# Patient Record
Sex: Female | Born: 1955 | Race: White | Hispanic: No | Marital: Married | Smoking: Current every day smoker
Health system: Southern US, Community
[De-identification: ages and names within clinical notes are randomized; demographics above are authoritative.]

## PROBLEM LIST (undated history)

## (undated) DIAGNOSIS — R569 Unspecified convulsions: Secondary | ICD-10-CM

## (undated) DIAGNOSIS — R42 Dizziness and giddiness: Secondary | ICD-10-CM

## (undated) DIAGNOSIS — H9192 Unspecified hearing loss, left ear: Secondary | ICD-10-CM

## (undated) DIAGNOSIS — F419 Anxiety disorder, unspecified: Secondary | ICD-10-CM

## (undated) HISTORY — DX: Unspecified convulsions: R56.9

## (undated) HISTORY — DX: Dizziness and giddiness: R42

## (undated) HISTORY — DX: Anxiety disorder, unspecified: F41.9

## (undated) HISTORY — DX: Unspecified hearing loss, left ear: H91.92

---

## 2012-12-08 ENCOUNTER — Emergency Department: Payer: Self-pay | Admitting: Unknown Physician Specialty

## 2012-12-08 LAB — COMPREHENSIVE METABOLIC PANEL
Alkaline Phosphatase: 84 U/L (ref 50–136)
Anion Gap: 5 — ABNORMAL LOW (ref 7–16)
Bilirubin,Total: 0.2 mg/dL (ref 0.2–1.0)
Chloride: 103 mmol/L (ref 98–107)
Creatinine: 0.78 mg/dL (ref 0.60–1.30)
EGFR (Non-African Amer.): >60
Glucose: 93 mg/dL (ref 65–99)
Potassium: 4 mmol/L (ref 3.5–5.1)
SGOT(AST): 54 U/L — ABNORMAL HIGH (ref 15–37)
Total Protein: 7.4 g/dL (ref 6.4–8.2)

## 2012-12-08 LAB — CBC
HCT: 40.9 % (ref 35.0–47.0)
HGB: 13.7 g/dL (ref 12.0–16.0)
MCH: 31.1 pg (ref 26.0–34.0)
Platelet: 159 10*3/uL (ref 150–440)
WBC: 7.6 10*3/uL (ref 3.6–11.0)

## 2012-12-08 LAB — LIPASE, BLOOD: Lipase: 171 U/L (ref 73–393)

## 2013-02-11 LAB — COMPREHENSIVE METABOLIC PANEL
Albumin: 3.4 g/dL (ref 3.4–5.0)
Alkaline Phosphatase: 101 U/L (ref 50–136)
BUN: 7 mg/dL (ref 7–18)
Creatinine: 0.82 mg/dL (ref 0.60–1.30)
EGFR (African American): >60
EGFR (Non-African Amer.): >60
Glucose: 134 mg/dL — ABNORMAL HIGH (ref 65–99)
Osmolality: 276 (ref 275–301)
Potassium: 3.7 mmol/L (ref 3.5–5.1)
SGOT(AST): 26 U/L (ref 15–37)
Sodium: 138 mmol/L (ref 136–145)

## 2013-02-11 LAB — URINALYSIS, COMPLETE
Bilirubin,UR: NEGATIVE
Blood: NEGATIVE
Glucose,UR: NEGATIVE mg/dL (ref 0–75)
Ph: 6 (ref 4.5–8.0)
WBC UR: 2 /HPF (ref 0–5)

## 2013-02-11 LAB — CBC WITH DIFFERENTIAL/PLATELET
Basophil %: 0.6 %
Eosinophil #: 0.4 10*3/uL (ref 0.0–0.7)
Lymphocyte %: 22.1 %
MCH: 30.3 pg (ref 26.0–34.0)
MCV: 90 fL (ref 80–100)
Neutrophil #: 5.2 10*3/uL (ref 1.4–6.5)
Neutrophil %: 64.2 %
RBC: 4.28 10*6/uL (ref 3.80–5.20)
WBC: 8.1 10*3/uL (ref 3.6–11.0)

## 2013-02-12 ENCOUNTER — Inpatient Hospital Stay: Payer: Self-pay | Admitting: Internal Medicine

## 2013-02-13 LAB — CBC WITH DIFFERENTIAL/PLATELET
Basophil #: 0 10*3/uL (ref 0.0–0.1)
Basophil %: 0.1 %
Eosinophil %: 0 %
HCT: 33.3 % — ABNORMAL LOW (ref 35.0–47.0)
HGB: 11.1 g/dL — ABNORMAL LOW (ref 12.0–16.0)
Lymphocyte #: 1.2 10*3/uL (ref 1.0–3.6)
Lymphocyte %: 7.4 %
MCH: 30.1 pg (ref 26.0–34.0)
MCHC: 33.4 g/dL (ref 32.0–36.0)
MCV: 90 fL (ref 80–100)
Monocyte #: 0.4 x10 3/mm (ref 0.2–0.9)
Monocyte %: 2.7 %
Neutrophil %: 89.8 %
RBC: 3.7 10*6/uL — ABNORMAL LOW (ref 3.80–5.20)
WBC: 16.5 10*3/uL — ABNORMAL HIGH (ref 3.6–11.0)

## 2013-02-13 LAB — BASIC METABOLIC PANEL
Calcium, Total: 8.8 mg/dL (ref 8.5–10.1)
Chloride: 110 mmol/L — ABNORMAL HIGH (ref 98–107)
Co2: 30 mmol/L (ref 21–32)
Creatinine: 0.81 mg/dL (ref 0.60–1.30)
EGFR (Non-African Amer.): >60
Glucose: 179 mg/dL — ABNORMAL HIGH (ref 65–99)
Osmolality: 287 (ref 275–301)
Potassium: 5.2 mmol/L — ABNORMAL HIGH (ref 3.5–5.1)
Sodium: 142 mmol/L (ref 136–145)

## 2013-02-14 LAB — BASIC METABOLIC PANEL
BUN: 9 mg/dL (ref 7–18)
Creatinine: 0.72 mg/dL (ref 0.60–1.30)
EGFR (African American): >60
Glucose: 163 mg/dL — ABNORMAL HIGH (ref 65–99)
Potassium: 4.3 mmol/L (ref 3.5–5.1)

## 2013-02-17 LAB — CULTURE, BLOOD (SINGLE)

## 2013-06-05 ENCOUNTER — Ambulatory Visit: Payer: Self-pay

## 2013-06-10 ENCOUNTER — Ambulatory Visit: Payer: Self-pay | Admitting: Emergency Medicine

## 2013-07-26 ENCOUNTER — Emergency Department: Payer: Self-pay | Admitting: Emergency Medicine

## 2013-07-26 LAB — CBC
HCT: 39.5 % (ref 35.0–47.0)
HGB: 13.4 g/dL (ref 12.0–16.0)
MCHC: 33.9 g/dL (ref 32.0–36.0)
Platelet: 180 10*3/uL (ref 150–440)
WBC: 8.8 10*3/uL (ref 3.6–11.0)

## 2013-07-26 LAB — URINALYSIS, COMPLETE
Bilirubin,UR: NEGATIVE
Blood: NEGATIVE
Glucose,UR: NEGATIVE mg/dL (ref 0–75)
Ketone: NEGATIVE
Nitrite: NEGATIVE
Ph: 6 (ref 4.5–8.0)
Squamous Epithelial: 1
WBC UR: 3 /HPF (ref 0–5)

## 2013-07-26 LAB — COMPREHENSIVE METABOLIC PANEL
Anion Gap: 8 (ref 7–16)
BUN: 14 mg/dL (ref 7–18)
Calcium, Total: 8.8 mg/dL (ref 8.5–10.1)
Chloride: 104 mmol/L (ref 98–107)
Co2: 26 mmol/L (ref 21–32)
Creatinine: 0.86 mg/dL (ref 0.60–1.30)
EGFR (African American): >60
EGFR (Non-African Amer.): >60
Glucose: 82 mg/dL (ref 65–99)
Potassium: 3.5 mmol/L (ref 3.5–5.1)
SGOT(AST): 20 U/L (ref 15–37)
SGPT (ALT): 22 U/L (ref 12–78)
Total Protein: 7.1 g/dL (ref 6.4–8.2)

## 2013-07-26 LAB — DRUG SCREEN, URINE
Amphetamines, Ur Screen: NEGATIVE (ref ?–1000)
Barbiturates, Ur Screen: NEGATIVE (ref ?–200)
Cannabinoid 50 Ng, Ur ~~LOC~~: NEGATIVE (ref ?–50)
Cocaine Metabolite,Ur ~~LOC~~: NEGATIVE (ref ?–300)
MDMA (Ecstasy)Ur Screen: NEGATIVE (ref ?–500)
Methadone, Ur Screen: NEGATIVE (ref ?–300)
Opiate, Ur Screen: NEGATIVE (ref ?–300)
Phencyclidine (PCP) Ur S: NEGATIVE (ref ?–25)
Tricyclic, Ur Screen: NEGATIVE (ref ?–1000)

## 2013-07-26 LAB — ETHANOL
Ethanol %: 0.254 % — ABNORMAL HIGH (ref 0.000–0.080)
Ethanol: 254 mg/dL

## 2013-09-04 ENCOUNTER — Emergency Department: Payer: Self-pay | Admitting: Emergency Medicine

## 2013-09-04 LAB — COMPREHENSIVE METABOLIC PANEL
ALBUMIN: 4 g/dL (ref 3.4–5.0)
ALK PHOS: 78 U/L
Anion Gap: 9 (ref 7–16)
BUN: 9 mg/dL (ref 7–18)
Bilirubin,Total: 0.4 mg/dL (ref 0.2–1.0)
CO2: 23 mmol/L (ref 21–32)
Calcium, Total: 9.9 mg/dL (ref 8.5–10.1)
Chloride: 100 mmol/L (ref 98–107)
Creatinine: 1.15 mg/dL (ref 0.60–1.30)
EGFR (African American): >60
EGFR (Non-African Amer.): 53 — ABNORMAL LOW
Glucose: 125 mg/dL — ABNORMAL HIGH (ref 65–99)
Osmolality: 265 (ref 275–301)
Potassium: 3.8 mmol/L (ref 3.5–5.1)
SGOT(AST): 16 U/L (ref 15–37)
SGPT (ALT): 18 U/L (ref 12–78)
SODIUM: 132 mmol/L — AB (ref 136–145)
Total Protein: 7.8 g/dL (ref 6.4–8.2)

## 2013-09-04 LAB — URINALYSIS, COMPLETE
BILIRUBIN, UR: NEGATIVE
Bacteria: NONE SEEN
Blood: NEGATIVE
Glucose,UR: NEGATIVE mg/dL (ref 0–75)
KETONE: NEGATIVE
Nitrite: NEGATIVE
Ph: 7 (ref 4.5–8.0)
Protein: NEGATIVE
RBC,UR: NONE SEEN /HPF (ref 0–5)
SPECIFIC GRAVITY: 1.012 (ref 1.003–1.030)
Squamous Epithelial: <1

## 2013-09-04 LAB — CBC WITH DIFFERENTIAL/PLATELET
Basophil #: 0.1 10*3/uL (ref 0.0–0.1)
Basophil %: 0.6 %
EOS PCT: 0.5 %
Eosinophil #: 0.1 10*3/uL (ref 0.0–0.7)
HCT: 41.9 % (ref 35.0–47.0)
HGB: 14.6 g/dL (ref 12.0–16.0)
LYMPHS ABS: 3 10*3/uL (ref 1.0–3.6)
Lymphocyte %: 27.5 %
MCH: 31.3 pg (ref 26.0–34.0)
MCHC: 34.8 g/dL (ref 32.0–36.0)
MCV: 90 fL (ref 80–100)
Monocyte #: 0.5 x10 3/mm (ref 0.2–0.9)
Monocyte %: 4.5 %
NEUTROS PCT: 66.9 %
Neutrophil #: 7.3 10*3/uL — ABNORMAL HIGH (ref 1.4–6.5)
Platelet: 227 10*3/uL (ref 150–440)
RBC: 4.66 10*6/uL (ref 3.80–5.20)
RDW: 12.9 % (ref 11.5–14.5)
WBC: 11 10*3/uL (ref 3.6–11.0)

## 2013-09-04 LAB — HEMOGLOBIN A1C: Hemoglobin A1C: 5.4 % (ref 4.2–6.3)

## 2013-09-04 LAB — TROPONIN I: Troponin-I: <0.02 ng/mL

## 2013-09-06 ENCOUNTER — Inpatient Hospital Stay: Payer: Self-pay | Admitting: Internal Medicine

## 2013-09-06 LAB — CBC
HCT: 41 % (ref 35.0–47.0)
HGB: 14.1 g/dL (ref 12.0–16.0)
MCH: 30.6 pg (ref 26.0–34.0)
MCHC: 34.4 g/dL (ref 32.0–36.0)
MCV: 89 fL (ref 80–100)
Platelet: 217 10*3/uL (ref 150–440)
RBC: 4.61 10*6/uL (ref 3.80–5.20)
RDW: 12.7 % (ref 11.5–14.5)
WBC: 10.4 10*3/uL (ref 3.6–11.0)

## 2013-09-06 LAB — COMPREHENSIVE METABOLIC PANEL
ALBUMIN: 3.8 g/dL (ref 3.4–5.0)
AST: 16 U/L (ref 15–37)
Alkaline Phosphatase: 69 U/L
Anion Gap: 8 (ref 7–16)
BILIRUBIN TOTAL: 0.4 mg/dL (ref 0.2–1.0)
BUN: 21 mg/dL — AB (ref 7–18)
CREATININE: 1.74 mg/dL — AB (ref 0.60–1.30)
Calcium, Total: 9.4 mg/dL (ref 8.5–10.1)
Chloride: 102 mmol/L (ref 98–107)
Co2: 24 mmol/L (ref 21–32)
EGFR (Non-African Amer.): 32 — ABNORMAL LOW
GFR CALC AF AMER: 37 — AB
GLUCOSE: 114 mg/dL — AB (ref 65–99)
OSMOLALITY: 272 (ref 275–301)
Potassium: 3.5 mmol/L (ref 3.5–5.1)
SGPT (ALT): 15 U/L (ref 12–78)
Sodium: 134 mmol/L — ABNORMAL LOW (ref 136–145)
Total Protein: 7 g/dL (ref 6.4–8.2)

## 2013-09-06 LAB — TROPONIN I
Troponin-I: <0.02 ng/mL
Troponin-I: <0.02 ng/mL
Troponin-I: <0.02 ng/mL

## 2013-09-06 LAB — TSH: Thyroid Stimulating Horm: 3.11 u[IU]/mL

## 2013-09-06 LAB — CK TOTAL AND CKMB (NOT AT ARMC)
CK, Total: 92 U/L (ref 21–215)
CK-MB: 0.6 ng/mL (ref 0.5–3.6)

## 2013-09-06 LAB — CK-MB: CK-MB: 0.9 ng/mL (ref 0.5–3.6)

## 2013-09-06 LAB — PROTIME-INR
INR: 1
Prothrombin Time: 12.9 secs (ref 11.5–14.7)

## 2013-09-06 LAB — APTT: Activated PTT: 30.6 secs (ref 23.6–35.9)

## 2013-09-06 LAB — T4, FREE: Free Thyroxine: 1.28 ng/dL (ref 0.76–1.46)

## 2013-09-07 LAB — CBC WITH DIFFERENTIAL/PLATELET
BASOS ABS: 0 10*3/uL (ref 0.0–0.1)
BASOS PCT: 0.6 %
EOS ABS: 0.1 10*3/uL (ref 0.0–0.7)
Eosinophil %: 1 %
HCT: 34 % — ABNORMAL LOW (ref 35.0–47.0)
HGB: 11.4 g/dL — AB (ref 12.0–16.0)
Lymphocyte #: 2.9 10*3/uL (ref 1.0–3.6)
Lymphocyte %: 34.3 %
MCH: 30.3 pg (ref 26.0–34.0)
MCHC: 33.4 g/dL (ref 32.0–36.0)
MCV: 91 fL (ref 80–100)
MONO ABS: 0.4 x10 3/mm (ref 0.2–0.9)
MONOS PCT: 4.3 %
NEUTROS ABS: 5.1 10*3/uL (ref 1.4–6.5)
Neutrophil %: 59.8 %
Platelet: 170 10*3/uL (ref 150–440)
RBC: 3.75 10*6/uL — AB (ref 3.80–5.20)
RDW: 13.1 % (ref 11.5–14.5)
WBC: 8.6 10*3/uL (ref 3.6–11.0)

## 2013-09-07 LAB — LIPID PANEL
Cholesterol: 145 mg/dL (ref 0–200)
HDL Cholesterol: 40 mg/dL (ref 40–60)
Ldl Cholesterol, Calc: 79 mg/dL (ref 0–100)
Triglycerides: 128 mg/dL (ref 0–200)
VLDL CHOLESTEROL, CALC: 26 mg/dL (ref 5–40)

## 2013-09-07 LAB — BASIC METABOLIC PANEL
Anion Gap: 5 — ABNORMAL LOW (ref 7–16)
BUN: 18 mg/dL (ref 7–18)
CALCIUM: 8.1 mg/dL — AB (ref 8.5–10.1)
CHLORIDE: 106 mmol/L (ref 98–107)
CREATININE: 1.25 mg/dL (ref 0.60–1.30)
Co2: 27 mmol/L (ref 21–32)
EGFR (African American): 56 — ABNORMAL LOW
EGFR (Non-African Amer.): 48 — ABNORMAL LOW
GLUCOSE: 124 mg/dL — AB (ref 65–99)
Osmolality: 279 (ref 275–301)
Potassium: 3.4 mmol/L — ABNORMAL LOW (ref 3.5–5.1)
SODIUM: 138 mmol/L (ref 136–145)

## 2013-09-07 LAB — CK-MB: CK-MB: 0.9 ng/mL (ref 0.5–3.6)

## 2013-09-07 LAB — TROPONIN I: Troponin-I: <0.02 ng/mL

## 2013-09-18 DIAGNOSIS — R42 Dizziness and giddiness: Secondary | ICD-10-CM | POA: Insufficient documentation

## 2013-09-18 LAB — BASIC METABOLIC PANEL
BUN: 11 mg/dL (ref 4–21)
CREATININE: 0.9 mg/dL (ref 0.5–1.1)
Glucose: 94 mg/dL
Potassium: 4.3 mmol/L (ref 3.4–5.3)
Sodium: 142 mmol/L (ref 137–147)

## 2013-09-21 ENCOUNTER — Emergency Department: Payer: Self-pay | Admitting: Emergency Medicine

## 2013-09-21 LAB — URINALYSIS, COMPLETE
Bacteria: NONE SEEN
Bilirubin,UR: NEGATIVE
Blood: NEGATIVE
Glucose,UR: NEGATIVE mg/dL (ref 0–75)
Nitrite: NEGATIVE
Ph: 8 (ref 4.5–8.0)
Protein: 30
RBC,UR: 1 /HPF (ref 0–5)
Specific Gravity: 1.02 (ref 1.003–1.030)
WBC UR: 1 /HPF (ref 0–5)

## 2013-09-21 LAB — CBC WITH DIFFERENTIAL/PLATELET
BASOS ABS: 0 10*3/uL (ref 0.0–0.1)
Basophil %: 0.5 %
Eosinophil #: 0 10*3/uL (ref 0.0–0.7)
Eosinophil %: 0.3 %
HCT: 41.4 % (ref 35.0–47.0)
HGB: 14 g/dL (ref 12.0–16.0)
LYMPHS ABS: 1.6 10*3/uL (ref 1.0–3.6)
LYMPHS PCT: 17.4 %
MCH: 31.1 pg (ref 26.0–34.0)
MCHC: 33.9 g/dL (ref 32.0–36.0)
MCV: 92 fL (ref 80–100)
MONOS PCT: 3.5 %
Monocyte #: 0.3 x10 3/mm (ref 0.2–0.9)
Neutrophil #: 7.1 10*3/uL — ABNORMAL HIGH (ref 1.4–6.5)
Neutrophil %: 78.3 %
Platelet: 194 10*3/uL (ref 150–440)
RBC: 4.51 10*6/uL (ref 3.80–5.20)
RDW: 13 % (ref 11.5–14.5)
WBC: 9 10*3/uL (ref 3.6–11.0)

## 2013-09-21 LAB — TROPONIN I

## 2013-09-21 LAB — BASIC METABOLIC PANEL
Anion Gap: 7 (ref 7–16)
BUN: 13 mg/dL (ref 7–18)
CHLORIDE: 104 mmol/L (ref 98–107)
Calcium, Total: 9.5 mg/dL (ref 8.5–10.1)
Co2: 25 mmol/L (ref 21–32)
Creatinine: 1.14 mg/dL (ref 0.60–1.30)
EGFR (African American): >60
GFR CALC NON AF AMER: 54 — AB
GLUCOSE: 132 mg/dL — AB (ref 65–99)
Osmolality: 274 (ref 275–301)
Potassium: 4.2 mmol/L (ref 3.5–5.1)
SODIUM: 136 mmol/L (ref 136–145)

## 2013-09-29 ENCOUNTER — Emergency Department: Payer: Self-pay | Admitting: Emergency Medicine

## 2013-10-10 ENCOUNTER — Emergency Department: Payer: Self-pay | Admitting: Emergency Medicine

## 2013-10-10 LAB — COMPREHENSIVE METABOLIC PANEL
ALK PHOS: 76 U/L
Albumin: 4 g/dL (ref 3.4–5.0)
Anion Gap: 8 (ref 7–16)
BUN: 14 mg/dL (ref 7–18)
Bilirubin,Total: 0.5 mg/dL (ref 0.2–1.0)
CO2: 27 mmol/L (ref 21–32)
Calcium, Total: 9.4 mg/dL (ref 8.5–10.1)
Chloride: 102 mmol/L (ref 98–107)
Creatinine: 1.08 mg/dL (ref 0.60–1.30)
EGFR (African American): >60
GFR CALC NON AF AMER: 57 — AB
GLUCOSE: 138 mg/dL — AB (ref 65–99)
Osmolality: 276 (ref 275–301)
POTASSIUM: 3.9 mmol/L (ref 3.5–5.1)
SGOT(AST): 20 U/L (ref 15–37)
SGPT (ALT): 28 U/L (ref 12–78)
SODIUM: 137 mmol/L (ref 136–145)
Total Protein: 7.6 g/dL (ref 6.4–8.2)

## 2013-10-10 LAB — DRUG SCREEN, URINE
Amphetamines, Ur Screen: NEGATIVE (ref ?–1000)
BARBITURATES, UR SCREEN: NEGATIVE (ref ?–200)
Benzodiazepine, Ur Scrn: POSITIVE (ref ?–200)
COCAINE METABOLITE, UR ~~LOC~~: NEGATIVE (ref ?–300)
Cannabinoid 50 Ng, Ur ~~LOC~~: NEGATIVE (ref ?–50)
MDMA (Ecstasy)Ur Screen: NEGATIVE (ref ?–500)
METHADONE, UR SCREEN: NEGATIVE (ref ?–300)
Opiate, Ur Screen: NEGATIVE (ref ?–300)
Phencyclidine (PCP) Ur S: NEGATIVE (ref ?–25)
Tricyclic, Ur Screen: NEGATIVE (ref ?–1000)

## 2013-10-10 LAB — URINALYSIS, COMPLETE
BLOOD: NEGATIVE
Bilirubin,UR: NEGATIVE
GLUCOSE, UR: NEGATIVE mg/dL (ref 0–75)
Hyaline Cast: 1
NITRITE: NEGATIVE
PH: 6 (ref 4.5–8.0)
Protein: NEGATIVE
RBC,UR: 6 /HPF (ref 0–5)
Specific Gravity: 1.018 (ref 1.003–1.030)
Squamous Epithelial: 5

## 2013-10-10 LAB — ETHANOL

## 2013-10-10 LAB — CBC
HCT: 43.4 % (ref 35.0–47.0)
HGB: 14.5 g/dL (ref 12.0–16.0)
MCH: 30.3 pg (ref 26.0–34.0)
MCHC: 33.3 g/dL (ref 32.0–36.0)
MCV: 91 fL (ref 80–100)
Platelet: 256 10*3/uL (ref 150–440)
RBC: 4.76 10*6/uL (ref 3.80–5.20)
RDW: 13 % (ref 11.5–14.5)
WBC: 12.4 10*3/uL — ABNORMAL HIGH (ref 3.6–11.0)

## 2013-11-06 ENCOUNTER — Inpatient Hospital Stay: Payer: Self-pay | Admitting: Internal Medicine

## 2013-11-06 LAB — TROPONIN I

## 2013-11-06 LAB — PRO B NATRIURETIC PEPTIDE: B-TYPE NATIURETIC PEPTID: 70 pg/mL (ref 0–125)

## 2013-11-06 LAB — COMPREHENSIVE METABOLIC PANEL
ALBUMIN: 4.2 g/dL (ref 3.4–5.0)
ALK PHOS: 69 U/L
AST: 14 U/L — AB (ref 15–37)
Anion Gap: 6 — ABNORMAL LOW (ref 7–16)
BUN: 16 mg/dL (ref 7–18)
Bilirubin,Total: 0.5 mg/dL (ref 0.2–1.0)
CALCIUM: 9.6 mg/dL (ref 8.5–10.1)
CHLORIDE: 102 mmol/L (ref 98–107)
Co2: 27 mmol/L (ref 21–32)
Creatinine: 1.67 mg/dL — ABNORMAL HIGH (ref 0.60–1.30)
EGFR (African American): 39 — ABNORMAL LOW
EGFR (Non-African Amer.): 34 — ABNORMAL LOW
Glucose: 104 mg/dL — ABNORMAL HIGH (ref 65–99)
Osmolality: 272 (ref 275–301)
POTASSIUM: 3.4 mmol/L — AB (ref 3.5–5.1)
SGPT (ALT): 15 U/L (ref 12–78)
Sodium: 135 mmol/L — ABNORMAL LOW (ref 136–145)
Total Protein: 7.3 g/dL (ref 6.4–8.2)

## 2013-11-06 LAB — URINALYSIS, COMPLETE
BILIRUBIN, UR: NEGATIVE
Bacteria: NONE SEEN
Blood: NEGATIVE
GLUCOSE, UR: NEGATIVE mg/dL (ref 0–75)
Hyaline Cast: 18
LEUKOCYTE ESTERASE: NEGATIVE
NITRITE: NEGATIVE
Ph: 5 (ref 4.5–8.0)
Protein: NEGATIVE
SPECIFIC GRAVITY: 1.006 (ref 1.003–1.030)

## 2013-11-06 LAB — CBC
HCT: 38.6 % (ref 35.0–47.0)
HGB: 13 g/dL (ref 12.0–16.0)
MCH: 30.6 pg (ref 26.0–34.0)
MCHC: 33.7 g/dL (ref 32.0–36.0)
MCV: 91 fL (ref 80–100)
Platelet: 193 10*3/uL (ref 150–440)
RBC: 4.25 10*6/uL (ref 3.80–5.20)
RDW: 14 % (ref 11.5–14.5)
WBC: 9.3 10*3/uL (ref 3.6–11.0)

## 2013-11-07 LAB — CBC WITH DIFFERENTIAL/PLATELET
Basophil #: 0 10*3/uL (ref 0.0–0.1)
Basophil %: 0.6 %
EOS ABS: 0.2 10*3/uL (ref 0.0–0.7)
EOS PCT: 3 %
HCT: 32.3 % — ABNORMAL LOW (ref 35.0–47.0)
HGB: 10.8 g/dL — AB (ref 12.0–16.0)
LYMPHS ABS: 1.8 10*3/uL (ref 1.0–3.6)
Lymphocyte %: 33.6 %
MCH: 30.9 pg (ref 26.0–34.0)
MCHC: 33.4 g/dL (ref 32.0–36.0)
MCV: 93 fL (ref 80–100)
Monocyte #: 0.4 x10 3/mm (ref 0.2–0.9)
Monocyte %: 7.5 %
NEUTROS ABS: 3 10*3/uL (ref 1.4–6.5)
NEUTROS PCT: 55.3 %
Platelet: 132 10*3/uL — ABNORMAL LOW (ref 150–440)
RBC: 3.5 10*6/uL — ABNORMAL LOW (ref 3.80–5.20)
RDW: 13.9 % (ref 11.5–14.5)
WBC: 5.4 10*3/uL (ref 3.6–11.0)

## 2013-11-07 LAB — BASIC METABOLIC PANEL
ANION GAP: 3 — AB (ref 7–16)
BUN: 17 mg/dL (ref 7–18)
CALCIUM: 7.8 mg/dL — AB (ref 8.5–10.1)
CHLORIDE: 112 mmol/L — AB (ref 98–107)
CO2: 27 mmol/L (ref 21–32)
Creatinine: 1.05 mg/dL (ref 0.60–1.30)
EGFR (African American): >60
GFR CALC NON AF AMER: 59 — AB
Glucose: 89 mg/dL (ref 65–99)
OSMOLALITY: 284 (ref 275–301)
Potassium: 4.3 mmol/L (ref 3.5–5.1)
Sodium: 142 mmol/L (ref 136–145)

## 2013-11-07 LAB — LIPID PANEL
CHOLESTEROL: 167 mg/dL (ref 0–200)
HDL Cholesterol: 36 mg/dL — ABNORMAL LOW (ref 40–60)
LDL CHOLESTEROL, CALC: 111 mg/dL — AB (ref 0–100)
Triglycerides: 102 mg/dL (ref 0–200)
VLDL CHOLESTEROL, CALC: 20 mg/dL (ref 5–40)

## 2013-11-25 DIAGNOSIS — G40909 Epilepsy, unspecified, not intractable, without status epilepticus: Secondary | ICD-10-CM | POA: Insufficient documentation

## 2014-08-07 IMAGING — CT CT HEAD WITHOUT CONTRAST
3 of 6 series · 14 of 47 positions shown, 16 images · non-contrast
Comparison: 09/21/2013

CLINICAL DATA: Patient fell. History of vertigo and anxiety. Head
injury.

EXAM:
CT HEAD WITHOUT CONTRAST
CT CERVICAL SPINE WITHOUT CONTRAST
TECHNIQUE: Multidetector CT imaging of the head and cervical spine was
performed following the standard protocol without intravenous
contrast. Multiplanar CT image reconstructions of the cervical spine
were also generated.

[Series 5: soft tissue · axial · 0.35mm/px · z∈[-186,-36]mm · 8 of 97 slices shown, 10 images]
[im 11/97  brain]
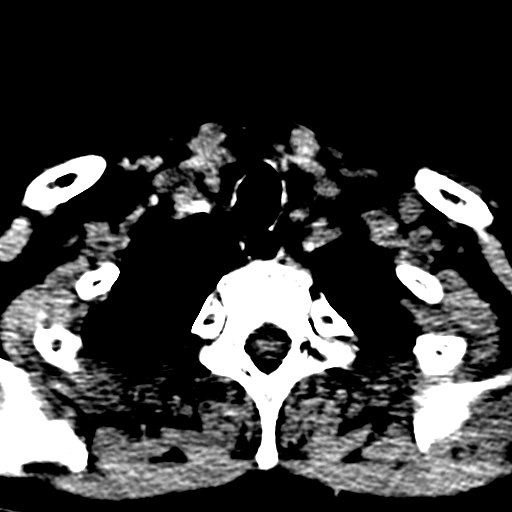
[im 11/97  bone]
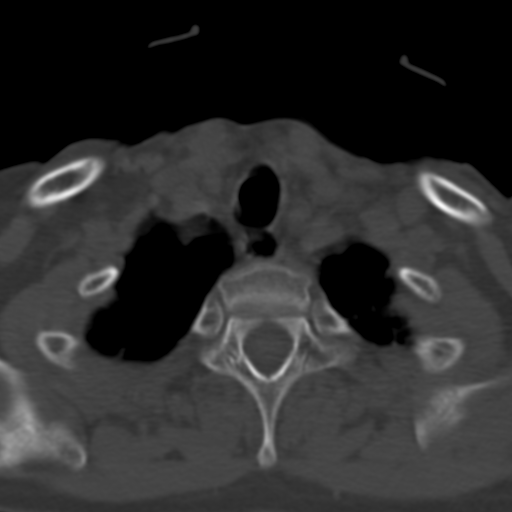
[im 22/97  brain]
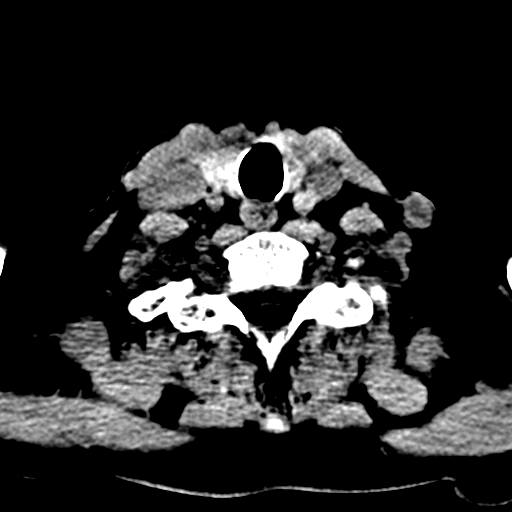
[im 33/97  brain]
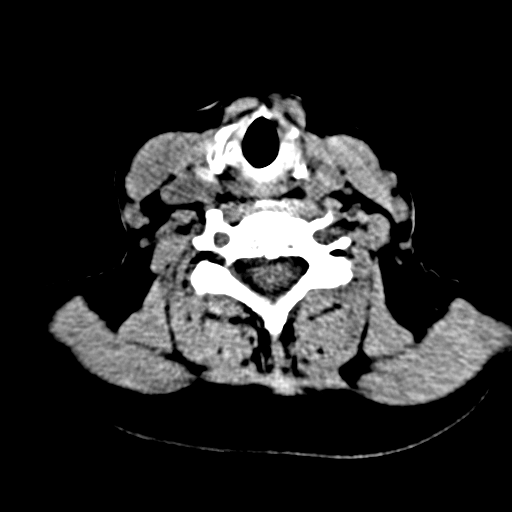
[im 43/97  brain]
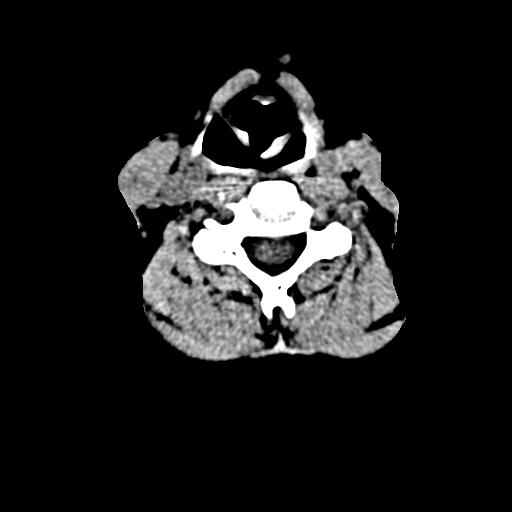
[im 54/97  brain]
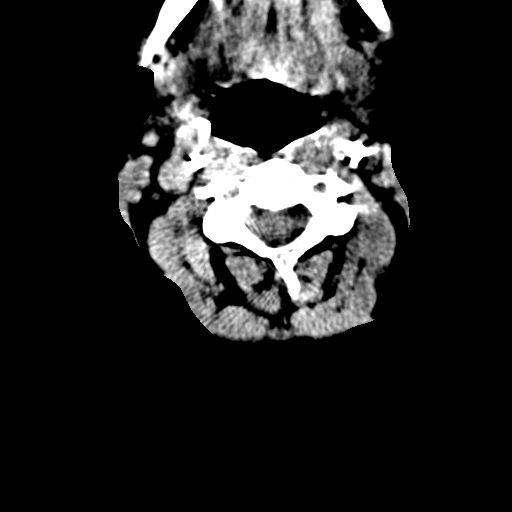
[im 54/97  bone]
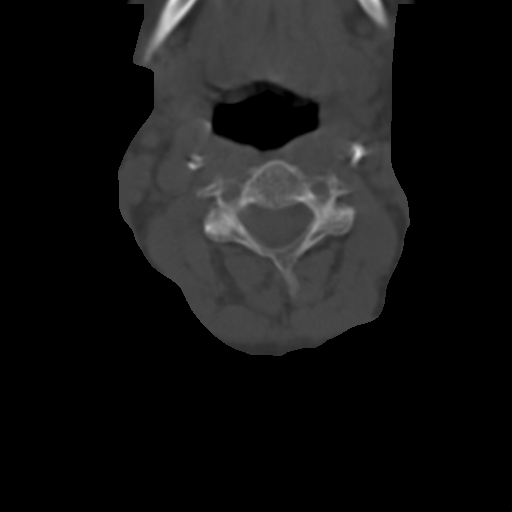
[im 65/97  brain]
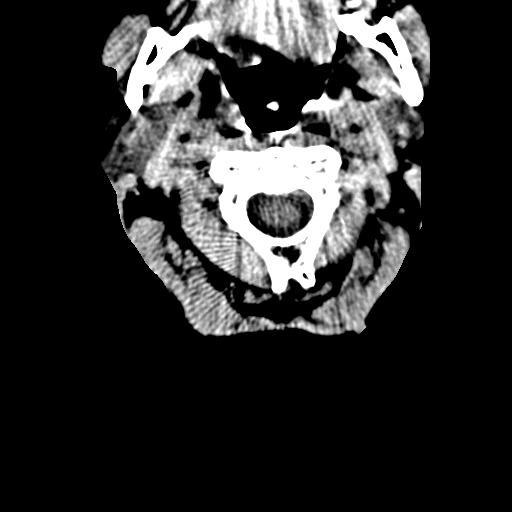
[im 75/97  brain]
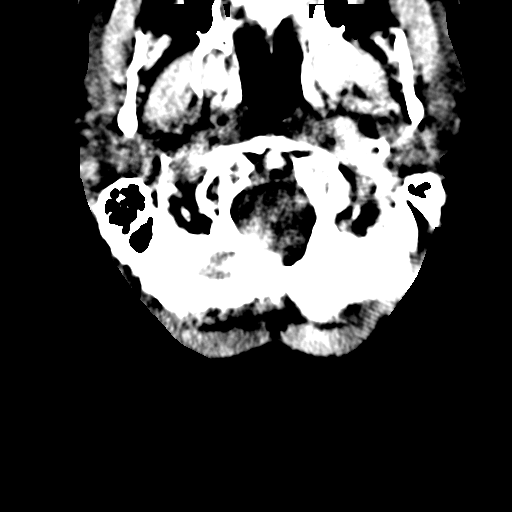
[im 86/97  brain]
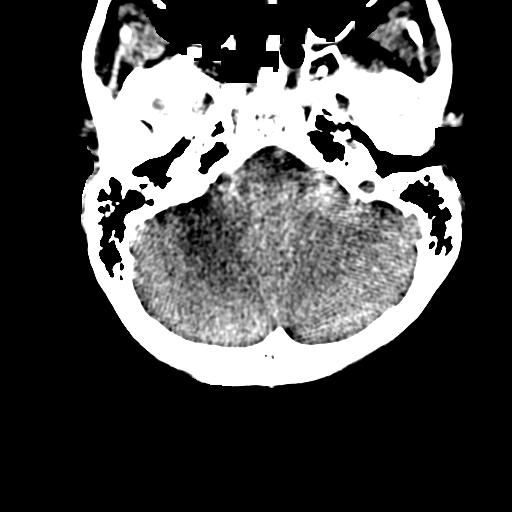

[Series 6: sagittal bone · sagittal · 0.21mm/px · 3 of 57 slices shown]
[im 19/57  brain]
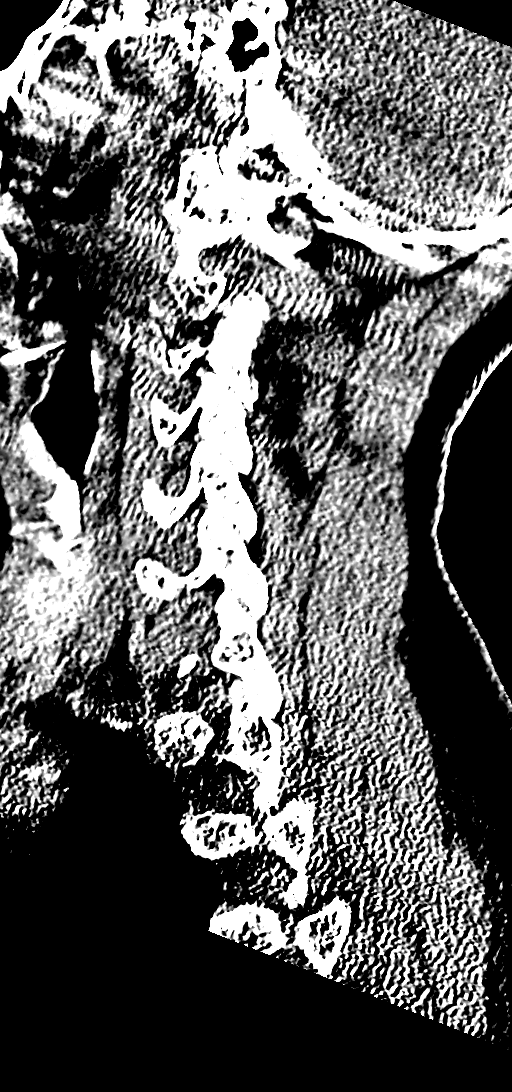
[im 29/57  brain]
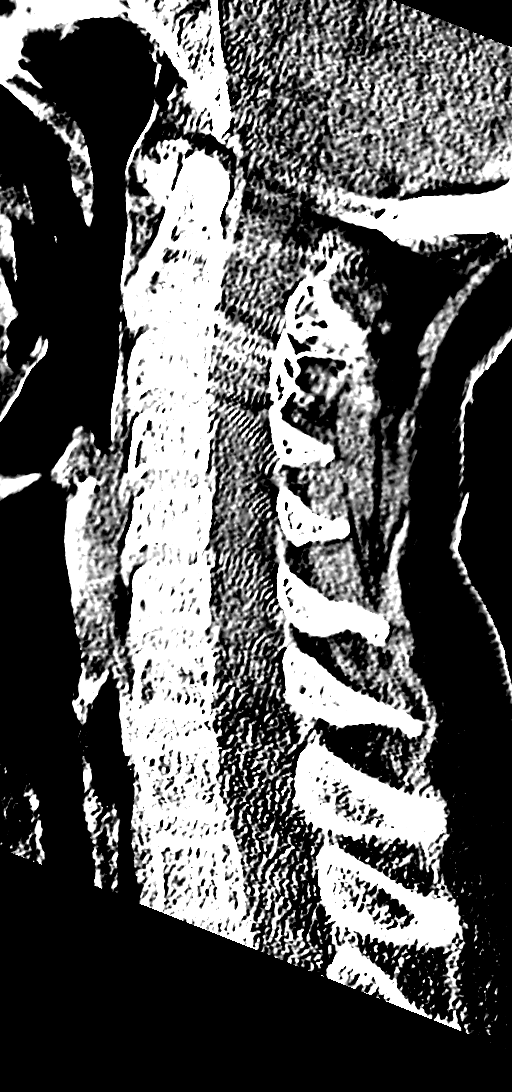
[im 38/57  brain]
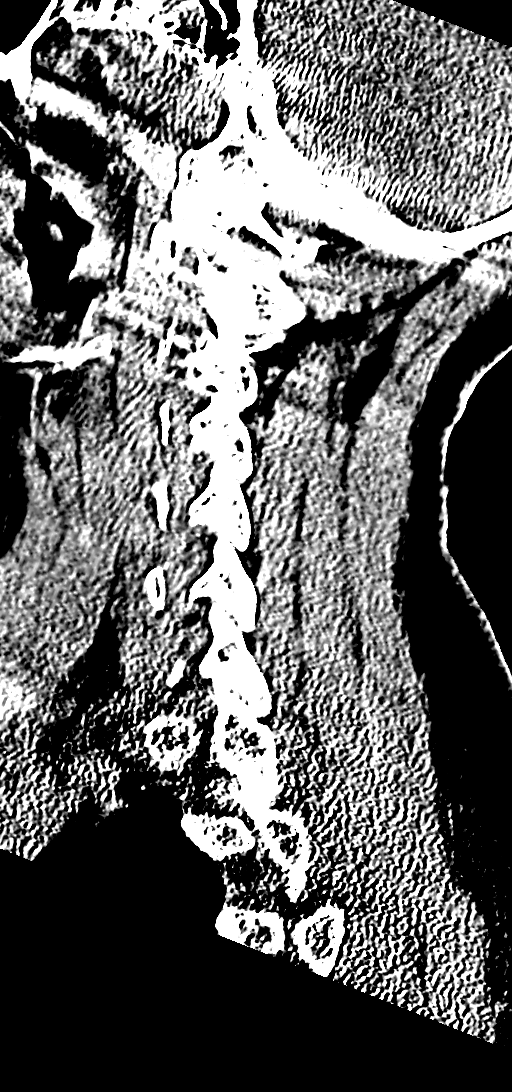

[Series 7: coronal bone · coronal · 0.38mm/px · 3 of 42 slices shown]
[im 14/42  brain]
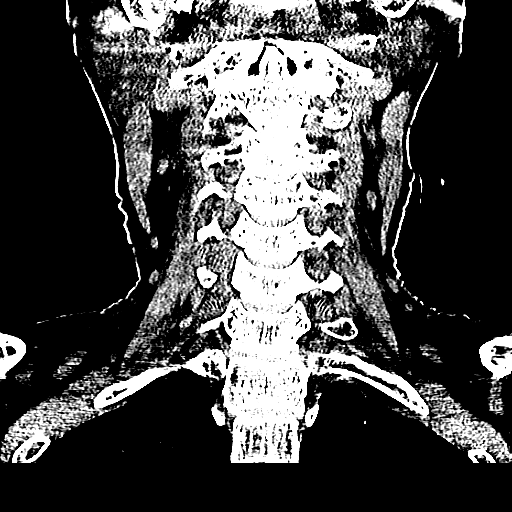
[im 19/42  brain]
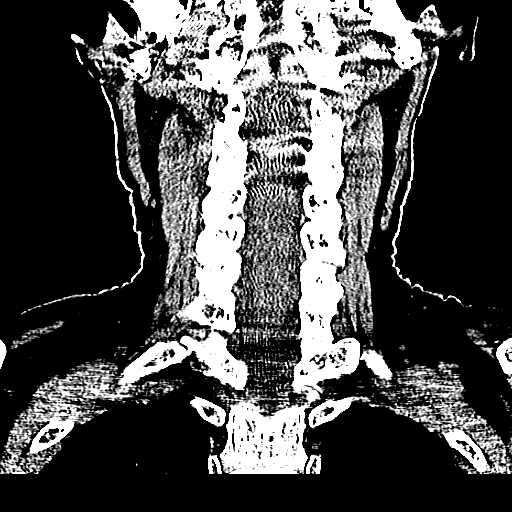
[im 23/42  brain]
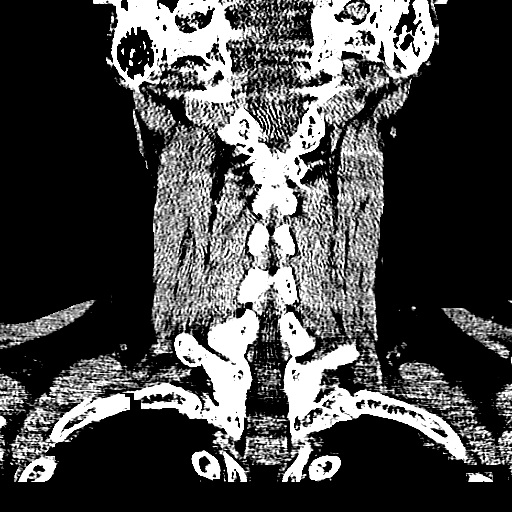

[14 of 47 positions shown; findings below may reference images not displayed]

FINDINGS: CT HEAD FINDINGS

There is no intra or extra-axial fluid collection or mass lesion.
The basilar cisterns and ventricles have a normal appearance. There
is no CT evidence for acute infarction or hemorrhage. Bone windows
are unremarkable.

CT CERVICAL SPINE FINDINGS

There is patient motion artifact. There is normal alignment of the
cervical spine. There is no evidence for acute fracture or
dislocation. Prevertebral soft tissues have a normal appearance.
Lung apices have a normal appearance.
IMPRESSION: 1.  No evidence for acute intracranial abnormality.
2. No evidence for acute abnormality of the cervical spine.
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
## 2014-12-11 NOTE — Discharge Summary (Signed)
PATIENT NAME:  [name] Hood, [name] MR#:  [id number] DATE OF BIRTH:  08-Oct-1955  DATE OF ADMISSION:  02/12/2013 DATE OF DISCHARGE:  02/14/2013  CHIEF COMPLAINT:  Shortness of breath, cough.    PRIMARY CARE PHYSICIAN:  The patient has no PCP here.  She moved from [place] recently.   DISCHARGE DIAGNOSES: 1.  Shortness of breath, likely secondary to underlying chronic obstructive pulmonary disease which is not diagnosed officially and acute bronchitis.  2.  Hyperkalemia.  3.  Nausea and vomiting, likely secondary to gagging, resolved.  4.  Leukocytosis, likely from steroids.  5.  Right ear chronic deafness and dizziness.   DISCHARGE MEDICATIONS:  1.  Vantin 200 mg every 12 hours for four days.  2.  Prednisone taper started 50 mg and taper by 10 mg daily until done in 5 days.  3.  Albuterol CFC free 2 puffs 4 times a day as needed for shortness of breath.   DIET:  Low sodium.   ACTIVITY:  As tolerated.   FOLLOW-UP:  Please follow with PCP after [facility] her Medicaid [facility] and obtaining one here within 1 to 2 weeks.   DISPOSITION:  Home.   HISTORY OF PRESENT ILLNESS AND HOSPITAL COURSE:  For full details of H and P, please see the dictation by Dr. Rudene Rearwish on June 25, but briefly this is a 59 year old female with a long time smoker, quit 1-1/2 years ago who came in for a productive cough, nausea, vomiting secondary to a cough reflex and right-sided chest pain with breathing.  She was given nebs and IV antibiotics, underwent an x-ray of the chest and a CT scan.  The CAT scan did not pick up any evidence for PE, but did show some borderline to mildly enlarged mediastinal lymph nodes which were nonspecific.  There was no CHF, nor pneumonia.  There was no pericardial effusion or pneumothorax.  The patient was admitted to the hospitalist service, antibiotics were changed to ceftriaxone as the patient might have had ALLERGY TO LEVAQUIN.  "Allergy was described as right-sided chest pain, which was  exacerbated."  The patient does have right-sided chronic pain which could be exacerbated when she gets sick, but it is unclear if it was the Levaquin or this was underlying disease process that brought her to the hospital, but nonetheless she was started on ceftriaxone, nebs, steroids.  She did improve with that.  The patient did develop leukocytosis which was likely secondary to the steroids.  The patient did have mild hyperkalemia which was treated with Kayexalate.  At this point, the patient is not hypoxic, ambulatory.  Blood cultures are negative.  UA is not suggestive of infection and she will be discharged and recommended to follow with a primary care physician here as she has relocated from [place] recently and needs to update outpatient pulmonary function tests to evaluate for possible underlying COPD which I suspect she has.  At this point, she will be discharged to outpatient follow-up.   Total time spent is 35 minutes.   CODE STATUS:  The patient is FULL CODE.    ____________________________ Krystal EatonShayiq Malia Corsi, MD sa:ea D: 02/14/2013 13:54:52 ET T: 02/14/2013 22:32:33 ET JOB#: [id number]  cc: Krystal EatonShayiq Mirra Basilio, MD, <Dictator> Krystal EatonSHAYIQ Anyjah Roundtree MD ELECTRONICALLY SIGNED 02/28/2013 14:59

## 2014-12-12 NOTE — H&P (Signed)
PATIENT NAME:  [name] Hood, [name] Hood MR#:  [id number] DATE OF BIRTH:  04-26-1956  DATE OF ADMISSION:  09/06/2013  PRIMARY DOCTOR:  None. [place] and she had an [facility].  EMERGENCY ROOM PHYSICIAN:  Dr. Scotty CourtStafford  CHIEF COMPLAINT: Chest pain and dizziness.   HISTORY OF PRESENT ILLNESS: A 59 year old female patient with no past medical history, comes in because of chest pain that started this morning. The patient says it is like an elephant is sitting on her chest. Feels like it is more with deep breaths, and not radiating to the arms. The patient has no radiation to the right side of chest or neck. Does have some trouble breathing. No cough. No fever. The patient's chest pain is not associated with nausea/vomiting. Does feel like heart is fluttering, and patient feels dizzy with that. No aggravating factors. No relieving factors. The patient's troponins are negative. EKG unremarkable, but patient has a family history, with father dying at age of 58 with sudden cardiac death. The patient also tells me that she is passing out recently, with syncopal episodes for the past 3 months. [facility], and also having episodes of dizziness. She says that she also feels irritable and anxious recently, and she has been having this vertigo, which is more like positional vertigo. She is unable to focus, and does have unstable gait. She feels that she needs something for her dizziness, as she is going to start a new job on Monday.   PAST MEDICAL HISTORY:  No hypertension or diabetes. Has anxiety.   FAMILY HISTORY: Father died at the age of 58 secondary to heart disease. He had sudden cardiac death at the age of 58 due to heart attack.   SOCIAL HISTORY: No smoking, no drinking, no drugs. The patient lives with a roommate. Starts working at [employer] on Monday.   ALLERGIES: She is allergic to ALL MYCINS, ERYTHROMYCIN,  TETRACYCLINE, AND VIBRAMYCIN.   SURGICAL HISTORY: She had a hysterectomy, and also history of tumor removed from throat age 89 because of cancer.   MEDICATIONS:  None.  REVIEW OF SYSTEMS:    CONSTITUTIONAL: Feels anxious. No fatigue. No fever.  EYES: The patient has trouble focusing because of her dizziness. The patient has no cataracts.  ENT: She is deaf in her right ear, and she has a ringing sensation in the left ear. She has tinnitus and feels vertigo. She tried meclizine before, that helped her with balance, but she still feels dizzy. No epistaxis. No difficulty swallowing.  RESPIRATORY: No cough. No wheezing.  CARDIOVASCULAR: Has chest pain since this morning on the left side below the left breast. Has no orthopnea. No PND. Does have feeling of palpitations. Complains of syncopal episode in the last 3 months, feeling like she is going to pass out and she has to hold something, and she feels a blackout sensation.  GENITOURINARY: No dysuria.  ENDOCRINE: No polyuria or nocturia.  HEMATOLOGIC: No anemia.  SKIN: No skin rashes.  MUSCULOSKELETAL: No joint pains.  NEUROLOGIC: No numbness or weakness. The patient has no dysarthria. No epilepsy. No headache. No CVA. The patient has unstable gait and also dizziness. That is also another reason for asking us to admit the patient. PSYCHIATRIC:  Does have anxiety. No insomnia. The patient does not have any depression history.   PHYSICAL EXAMINATION: VITAL SIGNS: Temperature 97.4, heart rate 83, blood pressure 108/59, sats 100% on room  air.  GENERAL: She is a well-developed, well-nourished female, very anxious, asking for medication for anxiety.  HEAD:  Atraumatic, normocephalic.  EYES: Pupils equal, reacting to light. No conjunctival pallor. EARS: No  tymapanic membrane congestion/no turbinate hypertrophy MOUTH: No lesions. No exudates.  NECK: Supple, symmetric. No masses. Thyroid in the midline, not enlarged. No JVD. Normal range of motion.   RESPIRATORY: Good respiratory effort. Clear to auscultation. No wheeze. No rales.  CARDIOVASCULAR: There is chest wall tenderness below the right breast. The patient has S1, S2 regular. No murmurs, no gallops. No peripheral edema. Pulses equal at carotid, femoral and dorsalis pedis.  GASTROINTESTINAL: No tenderness. No hepatosplenomegaly. No hernias. Bowel sounds are equal in all quadrants. Nondistended.  MUSCULOSKELETAL: The patient has an unstable gait. Able to move extremities x 4.  SKIN: Inspection is normal.  NEUROLOGIC: Alert, awake, oriented. Cranial nerves II through XII intact. Power 5/5 in upper and lower extremities. Sensation intact. DTRs 2+ bilaterally.  PSYCHIATRIC:  The patient is very, very anxious, and oriented to time, place, person.   ASSESSMENT AND PLAN: The patient is a 59 year old female patient with multiple medical complaints.  1. Chest pain that prompted her to come to ER. The patient has a strong family history of coronary artery disease. Admit her to observation to rule out acute MI. Continue aspirin and also nitroglycerin. Check fasting lipase. cycle troponin, 2. The patient probably needs stress test as outpatient.  3.  Dizziness and unstable gait. The patient has history of syncopal episodes at home. Admit to telemetry, and monitor and see how the heart rate is, and get a stroke workup because of  unstable gait and dizziness and syncopal episodes. The patient needs MRI of the brain, along with carotid ultrasound and echocardiogram. Continue aspirin. Get a fasting lipase, and get a physical therapy evaluation.   4. Possible anxiety attack. The patient was given Ativan 0.5 mg in the ER, and she says that really helped her, and asking for a prescription for that, so we will start her on Xanax 0.5 mg p.o. every 8 hours to help with anxiety.   5.  Acute renal failure. The patient has a BUN of 21 and creatinine 1.74, so we are going to give her IV fluids and see how her  kidney function will be tomorrow.   Time spent on history and physical is about 60 minutes.    ____________________________ Katha Hamming, MD sk:mr D: 09/06/2013 17:50:33 ET T: 09/06/2013 18:17:35 ET JOB#: [id number]  cc: Katha Hamming, MD, <Dictator> Katha Hamming MD ELECTRONICALLY SIGNED 09/29/2013 16:23

## 2014-12-12 NOTE — H&P (Signed)
PATIENT NAME:  [name] Hood, [name] Hood MR#:  [id number] DATE OF BIRTH:  [name] Hood, [name] Hood  DATE OF ADMISSION:  09/06/2013  ADDENDUM  LAB DATA:  CK total 92, CPK-MB 0.6, INR 1. WBC 10.4, hemoglobin 14.1, hematocrit 41, platelets 217. Electrolytes: Sodium 134, potassium 3.5, chloride 102, bicarb 24, BUN 21, creatinine 1.74, glucose 114. EKG: Normal sinus rhythm, 88 beats per minute. The patient has a T-wave inversion in V2, but nothing specific. The patient's LFTs are normal. TSH 3.11.  Note that she has been to the ER on January 15, that is 2 days ago, and 2 days ago her creatinine was normal.   SOCIAL HISTORY: Ex-smoker, used to smoke 10 cigarettes a day since age 59. The patient was a chronic smoker before, now quit.   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     ____________________________ Katha HammingSnehalatha Chandra Feger, MD sk:mr D: 09/06/2013 17:54:22 ET T: 09/06/2013 18:47:36 ET JOB#: [id number]  cc: Katha HammingSnehalatha Buren Havey, MD, <Dictator> Katha HammingSNEHALATHA Nic Lampe MD ELECTRONICALLY SIGNED 09/29/2013 16:24

## 2014-12-12 NOTE — Discharge Summary (Signed)
PATIENT NAME:  [name] Hood, SHELTON MR#:  [id number] DATE OF BIRTH:  [birth year]  DATE OF ADMISSION:  09/06/2013 DATE OF DISCHARGE:  09/07/2013  ADMITTING DIAGNOSIS: Chest pain.   DISCHARGE DIAGNOSES:  1.  Chest pain, noncardiac likely per cardiology. Normal echocardiogram.  2.  Hypotension, on beta blockers while in the hospital.  It resolved off beta blockers. 2.  Acute renal failure, likely due to dehydration, resolved on IV fluids.  3.  Dehydration of unclear etiology at this time.  4.  Dizziness, likely dehydration related.  5.  Hypoglycemia with hemoglobin A1c of 5.4 on recent labs. No diabetes mellitus.  6.  Anxiety.  7.  Tobacco abuse  DISCHARGE CONDITION:  Stable.  MEDICATIONS:  The patient is to continue citalopram 10 mg p.o. daily and meclizine 25 mg 3 times daily as needed.   HOME OXYGEN: None.   DIET: Regular with regular consistency.   ACTIVITY LIMITATIONS: As tolerated.   FOLLOWUP:   Appointment with [facility] in 1 week after discharge. The patient was also advised not to drink coffee or any caffeinated soft drinks and drink plenty of water.    CONSULTANTS: Dr. Adrian Blackwater, cardiology; care management, social work.   RADIOLOGIC STUDIES: Chest, portable single view, 09/06/2013, x-ray showed no active disease. CT scan of the head without contrast since the patient was complaining of balance problems, showed no evidence of acute ischemic infarction. No findings suspicious for acute intracranial hemorrhage. There were found to be white matter density changes in the right frontal lobe, which are subtle but stable and consistent with chronic small vessel ischemic change. No evidence of hydrocephalus or intracranial mass effect. No evidence of acute inflammation in the visualized portions of paranasal sinuses. No evidence of acute skull fracture, according to radiologist. CT angiogram of chest to rule out pulmonary embolism, 09/06/2013, showed no acute abnormalities. No evidence  of pulmonary arterial embolism. MRI of brain without contrast, 09/07/2013, revealed no evidence of acute intracranial abnormality or mass. Foci of white matter T2 signal abnormality nonspecific, but suggestive of mild chronic small vessel ischemic disease which is slightly advanced for age, according to the radiologist. Carotid ultrasound, 09/07/2013, showed soft and calcified plaque on the left and soft plaque on the right. There was no evidence of hemodynamically significant carotid stenosis. The vertebral arteries appeared patent and normal in flow direction. Echocardiogram, 09/07/2013, showed left ventricular ejection fraction by visual estimation of 60% to 65%. Otherwise, no acute abnormalities were found.   The patient is a 59 year old Caucasian female with past medical history significant for history of anxiety, who presented to the hospital with complaints of multiple medical issues including chest pain, as well as dizziness. Please refer to Dr. Suzanne Boron admission note done on 09/06/2013. On arrival to the hospital, the patient's temperature was 97.4, pulse was 83, respiration rate was not measured, blood pressure 108/59, O2 were 100% on room air. Physical exam was unremarkable. The patient's lab data done in the hospital, 09/06/2013, revealed elevation of BUN and creatinine to 21 and 1.74, sodium 134, glucose 114, otherwise BMP was unremarkable. Liver enzymes were normal. Cardiac enzymes x 4 were within normal limits. TSH was normal at 3.11 with thyroxine of 1.28. CBC was within normal limits. The patient's coagulation panel was normal. The patient had ABGs done which showed pH of 7.64, pCO2 was 27, pO2 was 92, saturation was 97.9% on room air. EKG showed normal sinus rhythm at 88 beats per minute, normal axis. No acute ST-T changes were  noted. The patient was admitted to the hospital for further evaluation. She underwent multiple radiologic evaluations; all of them were normal. The patient was  rehydrated, and it was felt that the patient's dizziness could have been related to dehydration, which was of unclear etiology. The patient's hemoglobin A1c was checked apparently recently, and it was normal at 5.4. With rehydration, the patient's kidney function improved. Her sodium level also normalized. On 09/07/2013, it was felt that she was stable to be discharged home. Cardiology consultation for chest pain was obtained; however, the cardiologist felt that it was very likely anxiety related. Due to anxiety, we initiated the patient on citalopram. For her dizziness, we also are giving her meclizine as needed. She was advised to drink plenty of fluids and avoid caffeinated drinks.  While in the hospital, she was noted to be intermittently hypotensive with systolic blood pressure ranging around 90s to 100s. However, orthostatic vital signs were within normal limits. It was felt that the patient's slight hypotension was very likely related to nitroglycerin, as well as beta blockers she received while in the hospital. She was advised to drink plenty of fluids, as mentioned above, and she is being discharged home. On the day of discharge, the patient's vital signs, temperature is 98.7, pulse was from 70s to 80s, respiration rate was 20, blood pressure ranging from 88 systolic to 105 to 109 systolic and 50s to 70s diastolic, and O2 sats were 98% on room air at rest.   TIME SPENT: 40 minutes on this patient.   ____________________________ Katharina Caperima Maryan Sivak, MD rv:dmm D: 09/07/2013 18:27:00 ET T: 09/08/2013 09:58:19 ET JOB#: [id number]  cc: [facility] Katharina Caperima Rashay Barnette, MD, <Dictator>   Kaislee Chao MD ELECTRONICALLY SIGNED 09/12/2013 14:23

## 2014-12-12 NOTE — Discharge Summary (Signed)
PATIENT NAME:  [name] Hood, [name] Hood MR#:  [id number] DATE OF BIRTH:  [birth year]  DATE OF ADMISSION:  11/06/2013 DATE OF DISCHARGE:  11/09/2013  PRIMARY CARE PHYSICIAN: Will be at the [facility].   FINAL DIAGNOSES: 1. Recurrent seizure.  2. Headache likely concussion related from fall.  3. Chronic vertigo.  4. Hypokalemia.   MEDICATIONS ON DISCHARGE: Include: Ativan 1 mg 3 times a day as needed for anxiety and nervousness, meclizine 25 mg 3 times a day as needed for vertigo, tramadol 50 mg 1 tablet every four hours as needed for pain, oxcarbazepine 150 mg 1 tablet twice a day for five days, then increase to 2 tablets twice a day, after that course we are going to increase oxcarbazepine  to 600 mg twice a day, starting Stepahnie 6.   DIET: Regular diet, regular consistency.   ACTIVITY: As tolerated.  FOLLOWUP:  Can follow up with [facility]. Follow up at the [facility]. Call Monday for an appointment.   HOSPITAL COURSE: The patient was admitted as an observation on 11/06/2013. She is being admitted to the hospital on 03/19 discharged on 03/22. The patient came in with dizziness, lightheadedness, vertigo and nystagmus. She had a headache at work, ended up having a syncopal episode at work where she hit her head on the cement. The patient was admitted for further work-up.   LABORATORY AND RADIOLOGICAL DATA DURING THE HOSPITAL COURSE: Included a glucose of 105. Troponin negative, BUN 16, creatinine 1.67, sodium 135, potassium 3.4, chloride 102, CO2 27, calcium 9.6. Liver function tests normal range. White blood cell count 9.3, H and H 13.0 and 38.6, platelet count 193. BNP is 70.   CT scan of the head: No acute intracranial abnormality.   EKG: Normal sinus rhythm, left atrial enlargement.   Urinalysis negative.   Orbit x-ray no evidence of foreign body.   MRI of the brain without contrast showed no acute abnormalities, mild chronic changes in the white  matter stable from prior study. '  Repeat creatinine 1.05, LDL 111, HDL 36, triglycerides 102. '  Neurology consultation was done by Dr. Mellody DrownMatthew Smith. 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 Headache could be migraine versus postepileptic versus concussion. They started Trileptal 150 mg b.i.d., gave 1 gram of valproic acid IV once. EEG was recommended as outpatient.   ASSESSMENT AND PLAN:  1. For the patient's recurrent seizure the patient was started on oxcarbazepine 150 mg twice a day to increase to 2 tablets twice a day and then on 04/06 up to 600 mg twice a day.  2. For the patient's headache, I believe this is likely concussion from hitting her head on the cement.  3. For chronic vertigo. He thinks it is secondary to the patient's seizure, can take meclizine p.r.n.  4. Hypokalemia. This was replaced as inpatient.   An EEG is recommended as outpatient. No driving at this point. Follow up at the [facility]. Can follow up with Dr. Sherryll BurgerShah neurology, in 1 to 2 weeks. The patient was given a work not out for one week, at least.   TIME SPENT ON DISCHARGE: 35 minutes.   ____________________________ Herschell Dimesichard J. Renae GlossWieting, MD rjw:sg D: 11/09/2013 16:01:35 ET T: 11/10/2013 06:53:30 ET JOB#: [id number]  cc: Herschell Dimesichard J. Renae GlossWieting, MD, <Dictator> [facility]  Salley ScarletICHARD J Yanis Juma MD ELECTRONICALLY SIGNED 11/14/2013 16:27

## 2014-12-12 NOTE — H&P (Signed)
PATIENT NAME:  [name] Hood, [name] Hood MR#:  [id number] DATE OF BIRTH:  [birth year]  DATE OF ADMISSION:  11/06/2013  REASON FOR ADMISSION: Dizziness, lightheadedness with apparently vertigo and nystagmus.   The patient does not have a doctor. She went to the [facility] once. She was referred by Dr. Margarita Grizzle.   HISTORY OF PRESENT ILLNESS: This is a very nice 59 year old female with history of anxiety  mostly, smoker, comes today complaining of vertigo. 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 The patient started getting dizzy, lightheaded working on line, and she went to the bathroom, got dizzier and fell, hitting her head on the ground. The patient was admitted to the Emergency Department where she was evaluated. She had this problem as a sudden onset, and on top of that she had headache all of this morning. Her headache is midline of the forehead, radiating from right side to left side, throbbing, intensity of 7 or 8 out of 10, happens at least twice a week. No significant aura symptoms. The patient had fainting episode today and felt like the room was spinning.   On examination by the ER physician, the patient had vertigo and nystagmus that resolved by itself, and she had profound ataxia with abnormal gait. At this moment, all of the symptoms have resolved. The patient had CT of the head that was normal, an MRI of the head that did not show any significant cerebellar abnormalities. The patient has been evaluated here for vertigo in the past with a CT scan of the brain, which also was normal. An echocardiogram did not show any major abnormalities or valvulopathy. She was kept on telemetry. It did not show any arrhythmias, and she had an ultrasound of the neck that did not show any significant blockage. The patient had significant exam today and  that information was relayed to Dr. Malvin Johns. Dr. Malvin Johns asked the patient to be admitted. I do not know if Dr. Malvin Johns was  aware of these findings and this test done before, since he ordered to do workup for TIA that has been done already.   I called Dr. Malvin Johns, I have not heard from him, to seek more guidance about the workup that is necessary for this admission.    REVIEW OF SYSTEMS:  Twelve system review of systems was done. CONSTITUTIONAL: No fever, fatigue or weakness.  EYES: No blurry vision or double vision right now, but the patient was seeing the room spin this morning. No glaucoma.  EARS, NOSE, THROAT: Positive vertigo. Positive balance problems. No tinnitus. No redness on the oropharynx. No difficulty swallowing.  RESPIRATORY: No cough, wheezing, hemoptysis. Positive smoker. COPD is negative. The patient has been given smoking cessation counseling for over 3 minutes. She is trying to quit smoking. She will try to do it by herself.  CARDIOVASCULAR: No chest pain, orthopnea or palpitations.  GASTROINTESTINAL: No nausea, vomiting, abdominal pain, constipation, diarrhea.  GENITOURINARY: No dysuria or hematuria.  ENDOCRINOLOGY: No polyuria or polydipsia.  HEMATOLOGIC AND LYMPHATIC: No anemia, easy bruising.  SKIN: No rashes or petechiae.   MUSCULOSKELETAL: No neck pain or back pain.  NEUROLOGIC: No numbness or tingling. No previous CVAs or TIAs.  Positive vertigo. Positive ataxia.  PSYCHIATRIC: No insomnia or depression.   PAST MEDICAL HISTORY: 1.  Ongoing tobacco abuse.  2.  Migraine headaches.  3.  Anxiety.  4.  Vertigo.   FAMILY HISTORY: Father died at the age of 50 due  to heart attack. No sudden death. No CVAs. No cancers.  SOCIAL HISTORY: The patient smokes 3 cigarettes a day. She has been cutting down. She does not drink alcohol. Does not use any drugs. She lives with a roommate. She has several grandchildren and she occasionally takes care of them. 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    ALLERGIES: ERYTHROMYCIN, TETRACYCLINE AND VIBRAMYCIN.   PAST SURGICAL HISTORY: 1.  Hysterectomy.   2.  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 Unknown if cancer.   MEDICATIONS: Tramadol 50 mg q.6 p.r.n. pain, Ativan 1 mg every 8 hours as needed for anxiety, meclizine 25 mg as needed for vertigo.   PHYSICAL EXAMINATION: VITAL SIGNS: Blood pressure of 97/52, pulse 70, respirations 18, temperature 98.3, oxygen saturation 98% on room air.  GENERAL: Alert and oriented x 3, in no acute distress. No respiratory distress. Hemodynamically stable.  HEENT: Pupils are equal and reactive. Extraocular movements are intact. Mucosae are moist. Anicteric sclerae. Pink conjunctivae. No oral lesions. No oropharyngeal exudates.  NECK: Supple. No JVD. No thyromegaly. No adenopathy. No carotid bruits. No rigidity.  CARDIOVASCULAR: Regular rate and rhythm. No murmurs, rubs or gallops are appreciated. No displacement of PMI.  LUNGS: Clear without any wheezing or crepitus. No use of accessory muscles.  ABDOMEN: Soft, nontender, nondistended. No hepatosplenomegaly. No masses. Bowel sounds are positive.  EXTREMITIES: No edema, cyanosis or clubbing. Pulses +2.  Capillary refill less than 3.   LYMPHATIC: Negative for lymphadenopathy in neck or supraclavicular areas.  JOINTS: No joint effusions or swelling.  NEUROLOGIC: Cranial nerves II through XII intact. Strength is 5/5 in all 4 extremities. Extraocular movements are normal. There is no nystagmus, vertical or horizontal at this moment. Accommodation of the pupils is normal. Uvula central. Tongue is central. No dysmetria. No diadochokinesis.  No problems with rapid alternating movements. Overall, her exam is normal. I did not ask the patient to get up and walk because she was afraid to do it, for what we will wait to the morning to do Romberg.   LABORATORY AND DIAGNOSTIC DATA: Creatinine is 1.67, sodium 135, potassium 3.4, glucose 104. LFTs were within normal limits. Troponin is negative. White count is 9.3, hemoglobin 13, platelet count 193.   EKG: No ST  depression or elevation. Normal sinus rhythm.   Urinalysis negative for urinary tract infection.   ASSESSMENT AND PLAN: This is a very nice 59 year old female with history of headaches, tobacco abuse, anxiety, comes with vertigo and falling down due to the vertigo.  1. Vertiginous syndrome. The patient is admitted to rule out TIA, mostly because some of her symptoms were related to ataxia, dysmetria and also apparently vertical nystagmus. The patient has headaches, and Ithink  that this could be related to her headaches as in complicated migraine. She has an MRI that is normal. She had a CT scan that was normal prior to that. She has been studied for vertigo before and she has a normal ultrasound of the carotid arteries with good anterograde flow on vertebral circulation. At this moment, we are going to admit her and give her aspirin and monitor her under telemetry. I tried to talk with Dr. Malvin Johns because he was the one who recommended admission, to see what other treatment we need to  implement or what other tests we have to do since all this has been done before. I think that overall  the patient if needs something else, will be a CTA  of the neck or an MRA of the neck or possibly  continuous Holter monitoring for at least 30 days. The patient at this movement is stable. Her symptoms have resolved. She does have acute kidney injury with elevation of creatinine of 1.67, which could be the result of intravascular volume depletion due to dehydration and could be the cause of orthostatic hypotension and the fall.  2.  She has hyponatremia and hypokalemia, which could be secondary to the same.   Other than that, the patient is doing okay.  DVT prophylaxis with Lovenox at 30 mg and also GI prophylaxis with Protonix.   The patient is a FULL CODE.   Smoking cessation counseling given for 4 minutes.   I spent about 45 minutes with this patient today.    ____________________________ Felipa Furnaceoberto Sanchez  Gutierrez, MD rsg:dmm D: 11/06/2013 20:51:56 ET T: 11/06/2013 21:21:46 ET JOB#: [id number]  cc: Felipa Furnaceoberto Sanchez Gutierrez, MD, <Dictator> Raelyn Racette Juanda ChanceSANCHEZ GUTIERRE MD ELECTRONICALLY SIGNED 11/16/2013 13:44

## 2014-12-12 NOTE — Discharge Summary (Signed)
PATIENT NAME:  [name] Hood, [name] Hood MR#:  [id number] DATE OF BIRTH:  1956-01-24  DATE OF ADMISSION:  09/06/2013 DATE OF DISCHARGE:  09/07/2013  ADMITTING DIAGNOSIS: Chest pain.   DISCHARGE DIAGNOSES:  1.  Chest pain, noncardiac likely per cardiology. Normal echocardiogram.  2.  Hypotension, on beta blockers while in the hospital.  It resolved off beta blockers. 2.  Acute renal failure, likely due to dehydration, resolved on IV fluids.  3.  Dehydration of unclear etiology at this time.  4.  Dizziness, likely dehydration related.  5.  Hypoglycemia with hemoglobin A1c of 5.4 on recent labs. No diabetes mellitus.  6.  Anxiety.  7.  Tobacco abuse  DISCHARGE CONDITION:  Stable.  MEDICATIONS:  The patient is to continue citalopram 10 mg p.o. daily and meclizine 25 mg 3 times daily as needed.   HOME OXYGEN: None.   DIET: Regular with regular consistency.   ACTIVITY LIMITATIONS: As tolerated.   FOLLOWUP:   Appointment with [facility] in 1 week after discharge. The patient was also advised not to drink coffee or any caffeinated soft drinks and drink plenty of water.    CONSULTANTS: Dr. Adrian Blackwater, cardiology; care management, social work.   RADIOLOGIC STUDIES: Chest, portable single view, 09/06/2013, x-ray showed no active disease. CT scan of the head without contrast since the patient was complaining of balance problems, showed no evidence of acute ischemic infarction. No findings suspicious for acute intracranial hemorrhage. There were found to be white matter density changes in the right frontal lobe, which are subtle but stable and consistent with chronic small vessel ischemic change. No evidence of hydrocephalus or intracranial mass effect. No evidence of acute inflammation in the visualized portions of paranasal sinuses. No evidence of acute skull fracture, according to radiologist. CT angiogram of chest to rule out pulmonary embolism, 09/06/2013, showed no acute abnormalities. No evidence  of pulmonary arterial embolism. MRI of brain without contrast, 09/07/2013, revealed no evidence of acute intracranial abnormality or mass. Foci of white matter T2 signal abnormality nonspecific, but suggestive of mild chronic small vessel ischemic disease which is slightly advanced for age, according to the radiologist. Carotid ultrasound, 09/07/2013, showed soft and calcified plaque on the left and soft plaque on the right. There was no evidence of hemodynamically significant carotid stenosis. The vertebral arteries appeared patent and normal in flow direction. Echocardiogram, 09/07/2013, showed left ventricular ejection fraction by visual estimation of 60% to 65%. Otherwise, no acute abnormalities were found.   The patient is a 59 year old Caucasian female with past medical history significant for history of anxiety, who presented to the hospital with complaints of multiple medical issues including chest pain, as well as dizziness. Please refer to Dr. Suzanne Boron admission note done on 09/06/2013. On arrival to the hospital, the patient's temperature was 97.4, pulse was 83, respiration rate was not measured, blood pressure 108/59, O2 were 100% on room air. Physical exam was unremarkable. The patient's lab data done in the hospital, 09/06/2013, revealed elevation of BUN and creatinine to 21 and 1.74, sodium 134, glucose 114, otherwise BMP was unremarkable. Liver enzymes were normal. Cardiac enzymes x 4 were within normal limits. TSH was normal at 3.11 with thyroxine of 1.28. CBC was within normal limits. The patient's coagulation panel was normal. The patient had ABGs done which showed pH of 7.64, pCO2 was 27, pO2 was 92, saturation was 97.9% on room air. EKG showed normal sinus rhythm at 88 beats per minute, normal axis. No acute ST-T changes were  noted. The patient was admitted to the hospital for further evaluation. She underwent multiple radiologic evaluations; all of them were normal. The patient was  rehydrated, and it was felt that the patient's dizziness could have been related to dehydration, which was of unclear etiology. The patient's hemoglobin A1c was checked apparently recently, and it was normal at 5.4. With rehydration, the patient's kidney function improved. Her sodium level also normalized. On 09/07/2013, it was felt that she was stable to be discharged home. Cardiology consultation for chest pain was obtained; however, the cardiologist felt that it was very likely anxiety related. Due to anxiety, we initiated the patient on citalopram. For her dizziness, we also are giving her meclizine as needed. She was advised to drink plenty of fluids and avoid caffeinated drinks.  While in the hospital, she was noted to be intermittently hypotensive with systolic blood pressure ranging around 90s to 100s. However, orthostatic vital signs were within normal limits. It was felt that the patient's slight hypotension was very likely related to nitroglycerin, as well as beta blockers she received while in the hospital. She was advised to drink plenty of fluids, as mentioned above, and she is being discharged home. On the day of discharge, the patient's vital signs, temperature is 98.7, pulse was from 70s to 80s, respiration rate was 20, blood pressure ranging from 88 systolic to 105 to 109 systolic and 50s to 70s diastolic, and O2 sats were 98% on room air at rest.   TIME SPENT: 40 minutes on this patient.   ____________________________ Katharina Caperima Shirlean Berman, MD rv:dmm D: 09/07/2013 18:27:36 ET T: 09/08/2013 09:58:19 ET JOB#: [id number]  cc: Katharina Caperima Jeferson Boozer, MD, <Dictator> [facility]

## 2014-12-12 NOTE — Consult Note (Signed)
Referring Physician:  James Ivanoff, Roselie Awkward :   Primary Care Physician:  James Ivanoff, [facility] : [facility], [address], [place], [place] [address], [place] [phone]  Reason for Consult: Admit Date: 06-Nov-2013  Chief Complaint: passing out  Reason for Consult: seizure   History of Present Illness: History of Present Illness:   59 yo RHD F presents with multiple episodes of  passing out and dizziness.  Pt states that her dizziness occurs regardless of position and does not change regardless.  She has had multiple episodes when she is just sitting down.  She reports that she sometimes has a metallic taste in her mouth.  She denies palpatations, chest pain or N/V.  She has passed out at least two times a month for the past year to the point that she does not drive.  Yesterdays episode happened at work, where it was witnessed.  She does not recall the episode but was confused for several minutes after waking up.  No incontinence or tongue biting noted.  ROS:  General denies complaints   HEENT no complaints   Lungs no complaints   Cardiac no complaints   GI no complaints   GU no complaints   Musculoskeletal no complaints   Extremities no complaints   Skin no complaints   Neuro headache   Endocrine no complaints   Psych no complaints   Past Medical/Surgical Hx:  asthma:   anxiety:   Vertigo:   Hysterectomy - Total:   Past Medical/ Surgical Hx:  Past Medical History see above   Past Surgical History see above   Home Medications: Medication Instructions Last Modified Date/Time  traMADol 50 mg tablet 1-2 tabs orally every 6 hours as needed 19-Mar-15 13:29  Ativan 1 mg tablet 1 tab(s) orally 3 times a day, As Needed- for Anxiety, Nervousness  19-Mar-15 13:29  meclizine 25 mg oral tablet 1 tab(s) orally 3 times a day 19-Mar-15 13:29   Allergies:  Erythromycin: Hives, GI Distress  Tetracycline: Hives, GI Distress  Vibramycin:  Rash  Allergies:  Allergies see above   Social/Family History: Employment Status: currently employed  Lives With: alone  Living Arrangements: apartment  Social History: +tob, no EtOH, no illicits  Family History: no seizures, no stroke   Vital Signs: **Vital Signs.:   20-Mar-15 19:39  Vital Signs Type Routine  Temperature Temperature (F) 98.3  Celsius 36.8  Temperature Source oral  Pulse Pulse 81  Respirations Respirations 18  Systolic BP Systolic BP 97  Diastolic BP (mmHg) Diastolic BP (mmHg) 60  Mean BP 72  Pulse Ox % Pulse Ox % 98  Pulse Ox Activity Level  At rest  Oxygen Delivery Room Air/ 21 %   Physical Exam: General: thin, anxious  HEENT: normocephalic, sclera nonicteric, oropharynx clear  Neck: supple, no JVD, no bruits  Chest: CTA B, no wheezing, good movement  Cardiac: RRR, no murmurs, no edema, 2+ pulses  Extremities: no C/C/E, FROM   Neurologic Exam: Mental Status: alert and oriented x 3, normal speech and language, follows complex commands  Cranial Nerves: PERRLA, EOMI with mild nystagmus,  nl VF, face symmetric, tongue midline, shoulder shrug equal  Motor Exam: 5/5 B normal, tone, no tremor  Deep Tendon Reflexes: 2+/4 B, plantars downgoing B, no Hoffman  Sensory Exam: pinprick, temperature, and vibration intact B  Coordination: FTN and HTS WNL, nl RAM, nl gait   Lab Results: Hepatic:  19-Mar-15 12:31   Bilirubin, Total 0.5  Alkaline Phosphatase 69 (45-117 NOTE: New  Reference Range 07/11/13)  SGPT (ALT) 15  SGOT (AST)  14  Total Protein, Serum 7.3  Albumin, Serum 4.2  Routine Chem:  19-Mar-15 12:31   B-Type Natriuretic Peptide ([facility]) 70 (Result(s) reported on 06 Nov 2013 at 01:05PM.)  20-Mar-15 04:54   Cholesterol, Serum 167  Triglycerides, Serum 102  HDL (INHOUSE)  36  VLDL Cholesterol Calculated 20  LDL Cholesterol Calculated  111 (Result(s) reported on 07 Nov 2013 at 05:38AM.)  Glucose, Serum 89  BUN 17  Creatinine (comp) 1.05   Sodium, Serum 142  Potassium, Serum 4.3  Chloride, Serum  112  CO2, Serum 27  Calcium (Total), Serum  7.8  Anion Gap  3  Osmolality (calc) 284  eGFR (African American) >60  eGFR (Non-African American)  59 (eGFR values <75m/min/1.73 m2 may be an indication of chronic kidney disease (CKD). Calculated eGFR is useful in patients with stable renal function. The eGFR calculation will not be reliable in acutely ill patients when serum creatinine is changing rapidly. It is not useful in  patients on dialysis. The eGFR calculation may not be applicable to patients at the low and high extremes of body sizes, pregnant women, and vegetarians.)  Cardiac:  19-Mar-15 12:31   Troponin I < 0.02 (0.00-0.05 0.05 ng/mL or less: NEGATIVE  Repeat testing in 3-6 hrs  if clinically indicated. >0.05 ng/mL: POTENTIAL  MYOCARDIAL INJURY. Repeat  testing in 3-6 hrs if  clinically indicated. NOTE: An increase or decrease  of 30% or more on serial  testing suggests a  clinically important change)  Routine UA:  19-Mar-15 13:20   Color (UA) Yellow  Clarity (UA) Clear  Glucose (UA) Negative  Bilirubin (UA) Negative  Ketones (UA) Trace  Specific Gravity (UA) 1.006  Blood (UA) Negative  pH (UA) 5.0  Protein (UA) Negative  Nitrite (UA) Negative  Leukocyte Esterase (UA) Negative (Result(s) reported on 06 Nov 2013 at 01:42PM.)  RBC (UA) 1 /HPF  WBC (UA) 1 /HPF  Bacteria (UA) NONE SEEN  Epithelial Cells (UA) <1 /HPF  Hyaline Cast (UA) 18 /LPF (Result(s) reported on 06 Nov 2013 at 01:42PM.)  Routine Hem:  20-Mar-15 04:54   WBC (CBC) 5.4  RBC (CBC)  3.50  Hemoglobin (CBC)  10.8  Hematocrit (CBC)  32.3  Platelet Count (CBC)  132  MCV 93  MCH 30.9  MCHC 33.4  RDW 13.9  Neutrophil % 55.3  Lymphocyte % 33.6  Monocyte % 7.5  Eosinophil % 3.0  Basophil % 0.6  Neutrophil # 3.0  Lymphocyte # 1.8  Monocyte # 0.4  Eosinophil # 0.2  Basophil # 0.0 (Result(s) reported on 07 Nov 2013 at 05:33AM.)    Radiology Results: MRI:    19-Mar-15 17:46, MRI Brain Without Contrast  MRI Brain Without Contrast   REASON FOR EXAM:    headache, dizziness, ataxia, vertical nystagmus  COMMENTS:       PROCEDURE: MR  - MR BRAIN WO CONTRAST  - Nov 06 2013  5:46PM     CLINICAL DATA:  Headache and dizziness.  Vertical nystagmus    EXAM:  MRI HEAD WITHOUT CONTRAST    TECHNIQUE:  Multiplanar, multiecho pulse sequences of the brain and surrounding  structures were obtained without intravenous contrast.    COMPARISON:  MRI 09/07/2013  FINDINGS:  Negative for acute infarct.    Scattered small white matter hyperintensities bilaterally are stable  since the prior MRI.    Basal ganglia and brainstem are normal. Negative for hemorrhage or  mass. No  edema or midline shift.    Ventricle size is normal.  Craniocervical junction normal.    Mild mucosal edema paranasal sinuses.     IMPRESSION:  No acute abnormality.  Mild chronic changes in the white matter are stable from the prior  study.      Electronically Signed    By: Franchot Gallo M.D.    On: 11/06/2013 18:01         Verified By: Truett Perna, M.D.,  CT:    19-Mar-15 13:02, CT Head Without Contrast  CT Head Without Contrast   REASON FOR EXAM:    headache, dizziness  COMMENTS:       PROCEDURE: CT  - CT HEAD WITHOUT CONTRAST  - Nov 06 2013  1:02PM     CLINICAL DATA:  Syncopal episode, argon    EXAM:  CT HEAD WITHOUT CONTRAST    TECHNIQUE:  Contiguous axial images were obtained from the base of the skull  through the vertex without intravenous contrast.    COMPARISON:  CT CERVICAL SPINE W/O CM dated 09/29/2013  FINDINGS:  The ventricles are normal in size and position. There is no  intracranial hemorrhage nor intracranial mass effect there is no  evidence of an evolving ischemic infarction. The cerebellum and  brainstem are normal in density.    At bone window settings the observed portions of the paranasal  sinuses and  mastoid air cells are clear. There is no evidenceof an  acute skull fracture     IMPRESSION:  There is no acute intracranial abnormality.      Electronically Signed    By: David  Martinique    On: 11/06/2013 13:07         Verified By: DAVID A. 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 Headaches-  could be migraines vs. post-epileptic VPA 1gm IV now, mg sulfate 1gm IV now, and reglan 107m IV now start trileptal 1575mBID x 1 week then 30054mID x 1 week, then 600m80mD PRN Fioricet for headache needs EEG as outpatient pt advised to get 7-8 hours of sleep nightly no driving or operating heavy machinery x 6 months can d/c tomorrow if headache better [facility]  Electronic Signatures: SmitJamison Neighbor)  (Signed 20-Mar-15 23:10)  Authored: REFERRING PHYSICIAN, Primary Care Physician, Consult, History of Present Illness, Review of Systems, PAST MEDICAL/SURGICAL HISTORY, HOME MEDICATIONS, ALLERGIES, Social/Family History, NURSING VITAL SIGNS, Physical Exam-, LAB RESULTS, RADIOLOGY RESULTS, Recommendations   Last Updated: 20-Mar-15 23:10 by SmitJamison Neighbor)

## 2014-12-12 NOTE — Consult Note (Signed)
PATIENT NAME:  [name] Hood, Ginnette M MR#:  [id number] DATE OF BIRTH:  1956-04-05  CARDIOLOGY CONSULTATION DATE OF CONSULTATION:  09/07/2013  CONSULTING PHYSICIAN:  Laurier NancyShaukat A. Indalecio Malmstrom, MD  INDICATION FOR CONSULTATION: Chest pain and dizziness.   This is a 59 year old white female with a past medical history of anxiety disorder came in to the hospital with chest pain. The chest pain was described as sharp, associated with shortness of breath and dizziness. [facility] Before passing out she had some dizziness and vertigo. She became very irritable and anxious after she woke up and was brought to the hospital. In the hospital she had disturbance in her gait.   PAST MEDICAL HISTORY: History of anxiety disorder.   FAMILY HISTORY: Positive for father having a heart attack at age 58 and had sudden death. Mother had hypertension.   SOCIAL HISTORY: Denies ETOH abuse but does smoke 1/2 pack per day.   ALLERGIES: ERYTHROMYCIN, TETRACYCLINE, VIBRAMYCIN.   SOCIAL HISTORY: She had a hysterectomy.   MEDICATIONS: None.  PHYSICAL EXAMINATION: GENERAL: She is alert, oriented x 3, in no acute distress, currently having MRI.  VITAL SIGNS: Blood pressure is 92/58, respirations are 20, pulse 87, saturation 97.  NECK: No JVD.  LUNGS: Clear.  HEART: Regular rate and rhythm. Normal S1, S2. No audible murmur.  ABDOMEN: Soft, nontender, positive bowel sounds.  EXTREMITIES: No pedal edema.  NEUROLOGIC: She appears to be intact.   Her EKG shows normal sinus rhythm, nonspecific ST-T changes.   LABORATORY DATA: Her cholesterol is 145, triglyceride 128, LDL 79, BUN 18. Creatinine is 1.25. Glucose 124. Her troponins are negative and her hemoglobin is 11.4. CT of the head was done which shows there is no evidence of acute infarct. She had a CT of the chest done which showed no acute abnormalities. No evidence of pulmonary emboli.   ASSESSMENT AND PLAN: The patient is having atypical chest  pain. Myocardial infarction has been ruled out. Chest pain seems to be musculoskeletal. We will look at echocardiogram to look at the wall motion, but appears like chest pain is throughout her chest and abdomen and is noncardiac chest pain. She should be given something for musculoskeletal chest pain and agree with getting a neurological work up, but it seems like it may be more of her anxiety disorder.   ____________________________ Laurier NancyShaukat A. [employer] Null, MD sak:sg D: 09/07/2013 11:01:55 ET T: 09/07/2013 11:51:58 ET JOB#: [id number]  cc: Laurier NancyShaukat A. Dyllen Menning, MD, <Dictator> Laurier NancySHAUKAT A Makhari Dovidio MD ELECTRONICALLY SIGNED 09/11/2013 8:42

## 2016-03-29 DIAGNOSIS — R42 Dizziness and giddiness: Secondary | ICD-10-CM

## 2016-03-29 DIAGNOSIS — G40909 Epilepsy, unspecified, not intractable, without status epilepticus: Secondary | ICD-10-CM
